# Patient Record
Sex: Female | Born: 1965 | Race: Black or African American | Hispanic: No | Marital: Single | Smoking: Never smoker
Health system: Southern US, Community
[De-identification: ages and names within clinical notes are randomized; demographics above are authoritative.]

## PROBLEM LIST (undated history)

## (undated) DIAGNOSIS — Z9889 Other specified postprocedural states: Secondary | ICD-10-CM

## (undated) DIAGNOSIS — R112 Nausea with vomiting, unspecified: Secondary | ICD-10-CM

---

## 2014-09-18 ENCOUNTER — Encounter: Payer: Self-pay | Admitting: Cardiology

## 2014-11-18 ENCOUNTER — Ambulatory Visit: Payer: Self-pay | Admitting: Cardiology

## 2014-11-22 ENCOUNTER — Encounter: Payer: Self-pay | Admitting: Cardiovascular Disease

## 2014-11-22 ENCOUNTER — Ambulatory Visit (INDEPENDENT_AMBULATORY_CARE_PROVIDER_SITE_OTHER): Payer: BC Managed Care – PPO | Admitting: Cardiovascular Disease

## 2014-11-22 VITALS — BP 100/62 | HR 93 | Ht 60.0 in | Wt 154.0 lb

## 2014-11-22 DIAGNOSIS — R140 Abdominal distension (gaseous): Secondary | ICD-10-CM

## 2014-11-22 DIAGNOSIS — Z136 Encounter for screening for cardiovascular disorders: Secondary | ICD-10-CM

## 2014-11-22 DIAGNOSIS — R0602 Shortness of breath: Secondary | ICD-10-CM

## 2014-11-22 DIAGNOSIS — K219 Gastro-esophageal reflux disease without esophagitis: Secondary | ICD-10-CM

## 2014-11-22 DIAGNOSIS — R1310 Dysphagia, unspecified: Secondary | ICD-10-CM

## 2014-11-22 MED ORDER — OMEPRAZOLE 20 MG PO CPDR: 20.0000 mg | DELAYED_RELEASE_CAPSULE | Freq: Every day | ORAL | Status: DC

## 2014-11-22 NOTE — Patient Instructions (Signed)
   Begin Omeprazole 20mg  daily - new sent to pharm Continue all other medications.   Follow up in  2 months

## 2014-11-22 NOTE — Progress Notes (Signed)
Patient ID: Sabrina Peterson, female   DOB: [birth year], 48 y.o.   MRN: [id number]       CARDIOLOGY CONSULT NOTE  Patient ID: Sabrina Peterson MRN: [id number] DOB/AGE: December 19, 1966 48 y.o.  Admit date: (Not on file) Primary Physician WADE, Silverio Lay, NP  Reason for Consultation: SOB  HPI: The patient is a 48 year old woman with no significant past medical history who has been experiencing some shortness of breath. A recent chest x-ray on 11/05/2014 was normal. ECG performed in the office today demonstrates normal sinus rhythm with no ischemic ST segment or T-wave abnormalities, heart rate 94 bpm. Upon further discussion, she tells me she has been having intermittent shortness of breath for one month. It may occur more frequently after eating. She feels like she is unable to take a deep breath. She has intermittent retrosternal chest pains. She has been having some dysphagia for solid foods. She does not smoke cigarettes or drink alcohol. She has occasional lightheadedness but denies syncope. She denies orthopnea, leg swelling, palpitations, and paroxysmal nocturnal dyspnea. She reportedly underwent abdominal imaging and stool studies at [facility] and she tells me they were found to be normal. She has been experiencing more belching and flatus. She said she sometimes snores but denies morning headaches. She does have insomnia and was prescribed trazodone. She does have diminished energy levels. She was recently diagnosed as being perimenopausal. She sometimes gets nauseous after eating.  Soc: No alcohol or tobacco use. Married. 2 children ages 28 and 80 in the [place]. 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 Lives in [place], [place].    Allergies  Allergen Reactions  . Prednisone Anxiety    Shakes and nervousness     No current outpatient prescriptions on file.   No current facility-administered medications for this visit.    No past medical history on  file.  No past surgical history on file.  History   Social History  . Marital Status: Single    Spouse Name: N/A    Number of Children: N/A  . Years of Education: N/A   Occupational History  . Not on file.   Social History Main Topics  . Smoking status: Not on file  . Smokeless tobacco: Never Used  . Alcohol Use: No  . Drug Use: No  . Sexual Activity: Yes   Other Topics Concern  . Not on file   Social History Narrative  . No narrative on file     No family history of premature CAD in 1st degree relatives.  Prior to Admission medications   Not on File     Review of systems complete and found to be negative unless listed above in HPI     Physical exam Blood pressure 100/62, pulse 93, height 5' (1.524 m), weight 154 lb (69.854 kg), SpO2 98 %. General: NAD Neck: No JVD, no thyromegaly or thyroid nodule.  Lungs: Clear to auscultation bilaterally with normal respiratory effort. CV: Nondisplaced PMI. Regular rate and rhythm, normal S1/S2, no S3/S4, no murmur.  No peripheral edema.  No carotid bruit.  Normal pedal pulses.  Abdomen: Soft, mild infraumbilical tenderness, no guarding/rebound, no hepatosplenomegaly, no distention.  Skin: Intact without lesions or rashes.  Neurologic: Alert and oriented x 3.  Psych: Normal affect. Extremities: No clubbing or cyanosis.  HEENT: Normal.   ECG: Most recent ECG reviewed.  Labs:  No results found for: WBC, HGB, HCT, MCV, PLT No results for input(s): NA, K, CL,  CO2, BUN, CREATININE, CALCIUM, PROT, BILITOT, ALKPHOS, ALT, AST, GLUCOSE in the last 168 hours.  Invalid input(s): LABALBU No results found for: CKTOTAL, CKMB, CKMBINDEX, TROPONINI No results found for: CHOL No results found for: HDL No results found for: LDLCALC No results found for: TRIG No results found for: CHOLHDL No results found for: LDLDIRECT       Studies: No results found.  ASSESSMENT AND PLAN:  1. Shortness of breath: Her symptoms appear to be  related to GERD, given her early satiety, belching, flatus, dysphagia for solids, nausea after eating, etc. I gave her extensive counseling on foods which provoke acid production. I will prescribe omeprazole 20 mg daily for a minimum of 3 months. I do not feel cardiovascular testing is warranted at this time. 2. GERD: As per #1. Dietary counseling provided. Will start omeprazole 20 mg. Will obtain results of studies performed at [facility].  Dispo: f/u 3 months.  Signed: Kate Sable, M.D., F.A.C.C.  11/22/2014, 2:07 PM

## 2015-01-27 ENCOUNTER — Ambulatory Visit: Payer: BC Managed Care – PPO | Admitting: Cardiovascular Disease

## 2017-05-03 ENCOUNTER — Other Ambulatory Visit (HOSPITAL_COMMUNITY): Payer: Self-pay | Admitting: Nurse Practitioner

## 2017-05-03 DIAGNOSIS — R1900 Intra-abdominal and pelvic swelling, mass and lump, unspecified site: Secondary | ICD-10-CM

## 2017-05-11 ENCOUNTER — Ambulatory Visit (HOSPITAL_COMMUNITY): Payer: Self-pay

## 2017-12-20 HISTORY — PX: BIOPSY ENDOMETRIAL: PRO11

## 2019-09-26 ENCOUNTER — Other Ambulatory Visit: Payer: Self-pay | Admitting: Internal Medicine

## 2019-09-26 DIAGNOSIS — Z1231 Encounter for screening mammogram for malignant neoplasm of breast: Secondary | ICD-10-CM

## 2019-11-12 ENCOUNTER — Ambulatory Visit: Payer: Self-pay

## 2020-01-30 ENCOUNTER — Other Ambulatory Visit: Payer: Self-pay | Admitting: Obstetrics & Gynecology

## 2020-01-30 DIAGNOSIS — R928 Other abnormal and inconclusive findings on diagnostic imaging of breast: Secondary | ICD-10-CM

## 2020-02-04 ENCOUNTER — Ambulatory Visit
Admission: RE | Admit: 2020-02-04 | Discharge: 2020-02-04 | Disposition: A | Discharge status: Home / Self Care | Payer: 59 | Source: Ambulatory Visit | Attending: Obstetrics & Gynecology | Admitting: Obstetrics & Gynecology

## 2020-02-04 ENCOUNTER — Other Ambulatory Visit: Payer: Self-pay

## 2020-02-04 DIAGNOSIS — R928 Other abnormal and inconclusive findings on diagnostic imaging of breast: Secondary | ICD-10-CM

## 2021-01-31 IMAGING — US US BREAST*R* LIMITED INC AXILLA
1 series · 6 of 6 positions shown · non-contrast
Comparison: Previous exam(s).

CLINICAL DATA: Screening recall for a possible right breast mass.

EXAM:
DIGITAL DIAGNOSTIC RIGHT MAMMOGRAM WITH CAD AND TOMO
ULTRASOUND RIGHT BREAST

[Series 1: us breast*right* limited inc axilla · 0.06mm/px · 6 of 6 slices shown]
[im 1/6]
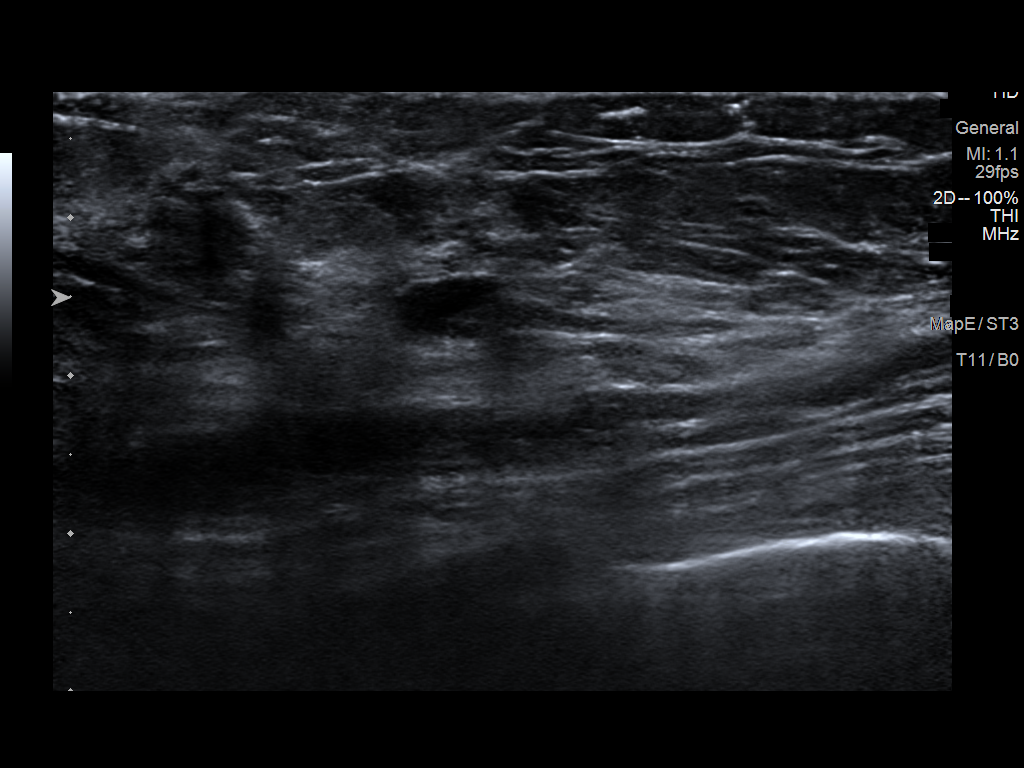
[im 2/6]
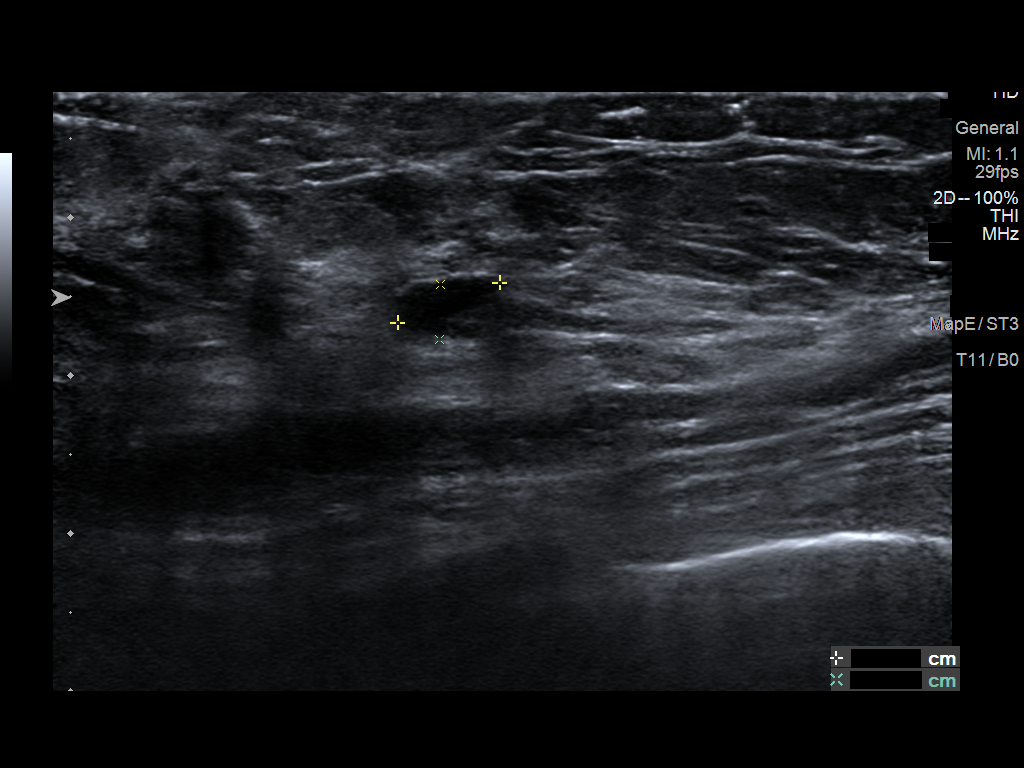
[im 3/6]
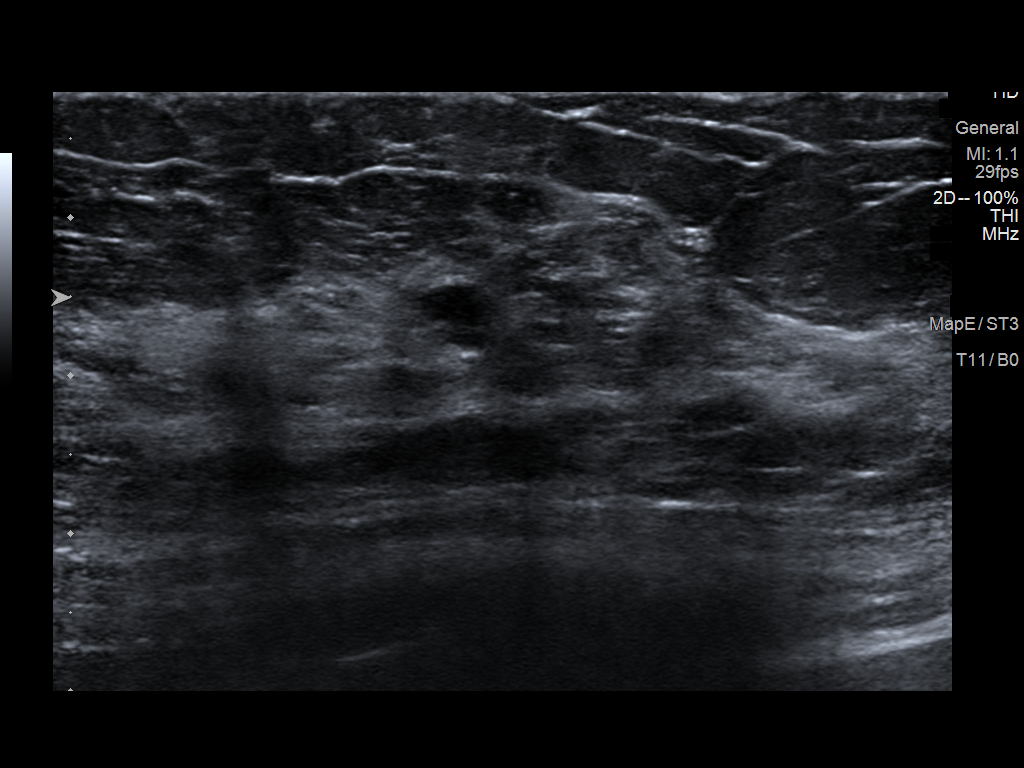
[im 4/6]
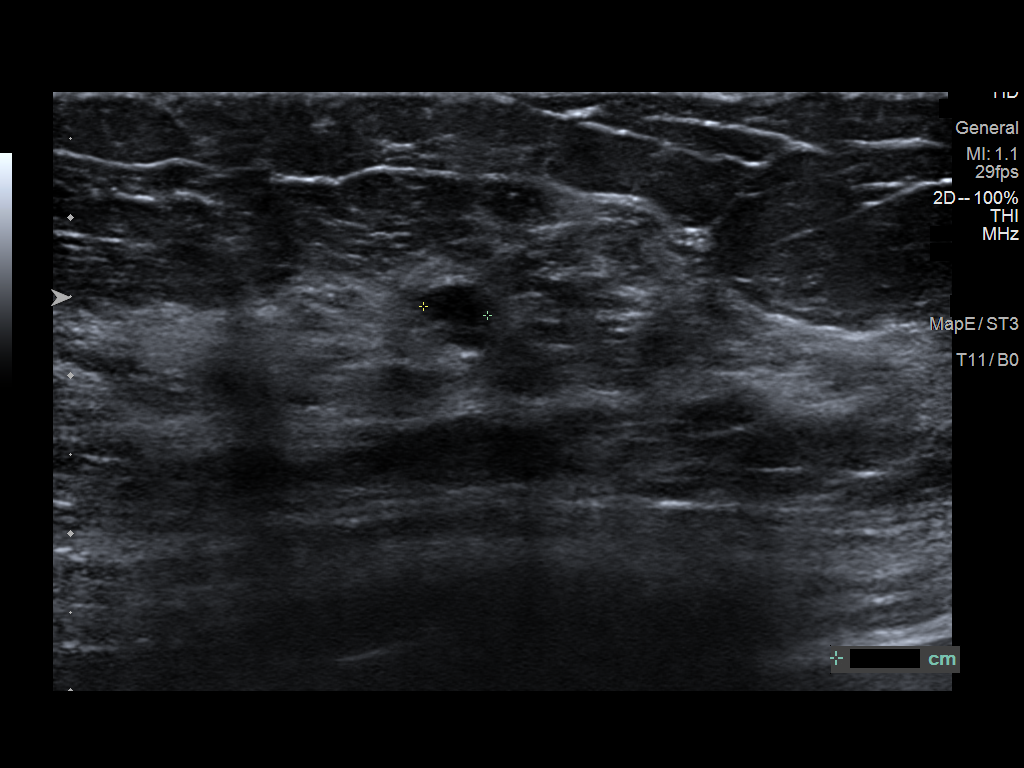
[im 5/6]
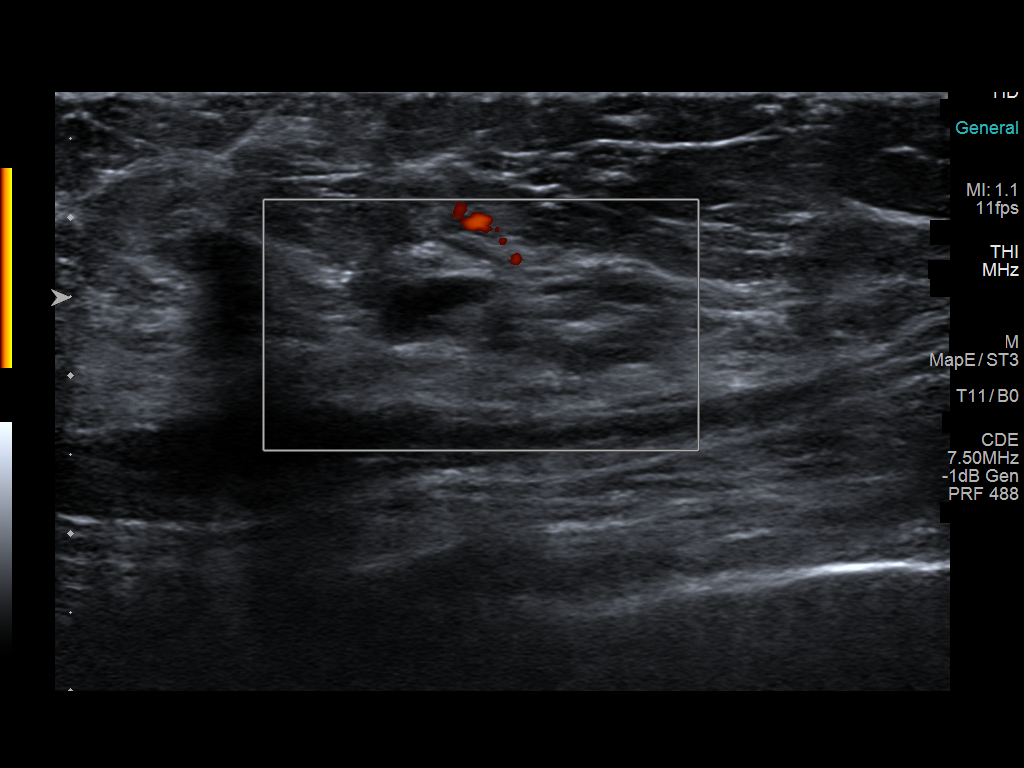
[im 6/6]
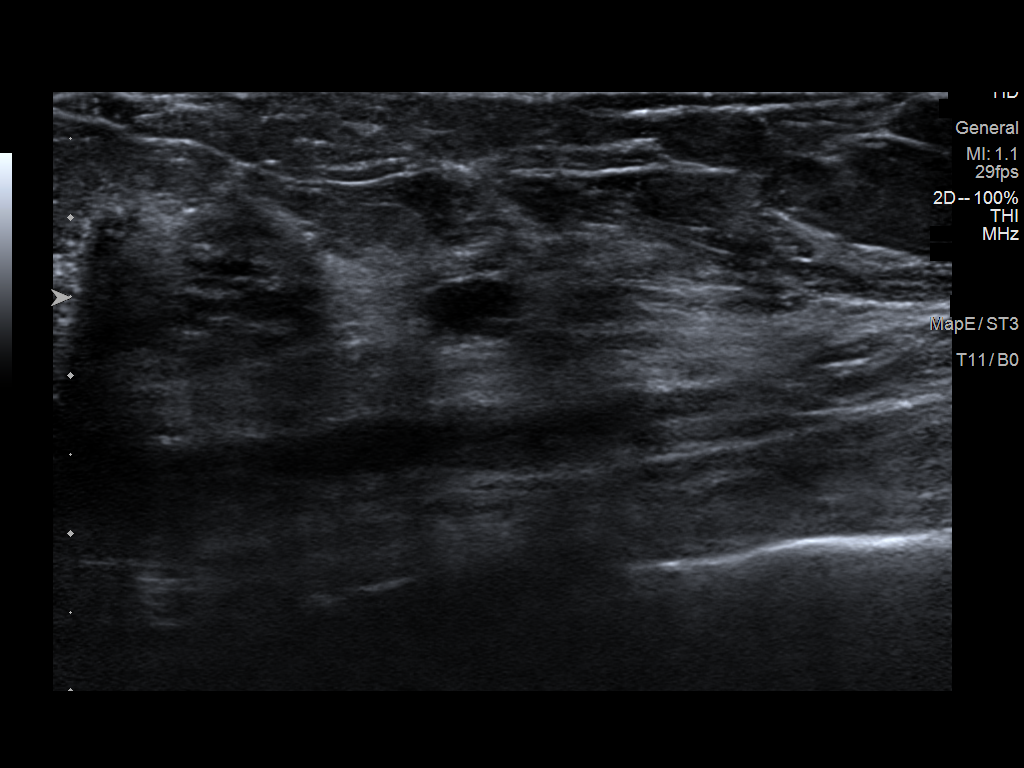

[6 of 6 positions shown; findings below may reference images not displayed]

ACR Breast Density Category c: The breast tissue is heterogeneously
dense, which may obscure small masses.
FINDINGS: On spot compression imaging, the possible mass right breast become
somewhat less well-defined, but does persist as a small oval mass
with a partly circumscribed margin. The mass lies medially at 2-3
o'clock.

Mammographic images were processed with CAD.

Targeted ultrasound is performed, showing a small cyst in the right
breast at 2 o'clock, 2 cm the nipple, middle depth, measuring 7 x 4
x 4 mm, consistent in size, shape and location to the mammographic
finding. There are no solid masses or suspicious lesions.
IMPRESSION: 1. No evidence of breast malignancy.
2. Small benign right breast cyst.

RECOMMENDATION:
1.  Screening mammogram in one year.(Code:[id number]).
2. The patient's mother was diagnosed with breast carcinoma at age
68 in her sister at age 42. This places the patient at an elevated
lifetime risk for developing breast carcinoma, over 20%, and given
her heterogeneously dense breast architecture mammographically,
supplemental screening is recommended, specifically annual screening
breast MRI based on the current mammogram Cancer Society and ACR
recommendations.

I have discussed the findings and recommendations with the patient.
If applicable, a reminder letter will be sent to the patient
regarding the next appointment.

BI-RADS CATEGORY  2: Benign.

## 2021-02-17 ENCOUNTER — Other Ambulatory Visit: Payer: Self-pay | Admitting: Obstetrics & Gynecology

## 2021-03-05 ENCOUNTER — Encounter (HOSPITAL_COMMUNITY)
Admission: RE | Admit: 2021-03-05 | Discharge: 2021-03-05 | Disposition: A | Discharge status: Home / Self Care | Payer: 59 | Source: Ambulatory Visit | Attending: Obstetrics & Gynecology | Admitting: Obstetrics & Gynecology

## 2021-03-05 ENCOUNTER — Encounter (HOSPITAL_COMMUNITY): Payer: Self-pay

## 2021-03-05 ENCOUNTER — Other Ambulatory Visit: Payer: Self-pay

## 2021-03-05 DIAGNOSIS — Z20822 Contact with and (suspected) exposure to covid-19: Secondary | ICD-10-CM | POA: Insufficient documentation

## 2021-03-05 DIAGNOSIS — Z01812 Encounter for preprocedural laboratory examination: Secondary | ICD-10-CM | POA: Insufficient documentation

## 2021-03-05 HISTORY — DX: Nausea with vomiting, unspecified: Z98.890

## 2021-03-05 HISTORY — DX: Nausea with vomiting, unspecified: R11.2

## 2021-03-05 LAB — TYPE AND SCREEN
ABO/RH(D): A POS
Antibody Screen: NEGATIVE

## 2021-03-05 LAB — COMPREHENSIVE METABOLIC PANEL
ALT: 16 U/L (ref 0–44)
AST: 18 U/L (ref 15–41)
Albumin: 4.3 g/dL (ref 3.5–5.0)
Alkaline Phosphatase: 51 U/L (ref 38–126)
Anion gap: 7 (ref 5–15)
BUN: 16 mg/dL (ref 6–20)
CO2: 25 mmol/L (ref 22–32)
Calcium: 9.4 mg/dL (ref 8.9–10.3)
Chloride: 106 mmol/L (ref 98–111)
Creatinine, Ser: 0.72 mg/dL (ref 0.44–1.00)
GFR, Estimated: >60 mL/min (ref >60)
Glucose, Bld: 144 mg/dL — ABNORMAL HIGH (ref 70–99)
Potassium: 3.5 mmol/L (ref 3.5–5.1)
Sodium: 138 mmol/L (ref 135–145)
Total Bilirubin: 0.7 mg/dL (ref 0.3–1.2)
Total Protein: 7.2 g/dL (ref 6.5–8.1)

## 2021-03-05 LAB — CBC
HCT: 40.1 % (ref 36.0–46.0)
Hemoglobin: 13.3 g/dL (ref 12.0–15.0)
MCH: 28.7 pg (ref 26.0–34.0)
MCHC: 33.2 g/dL (ref 30.0–36.0)
MCV: 86.6 fL (ref 80.0–100.0)
Platelets: 265 10*3/uL (ref 150–400)
RBC: 4.63 MIL/uL (ref 3.87–5.11)
RDW: 12.2 % (ref 11.5–15.5)
WBC: 6 10*3/uL (ref 4.0–10.5)
nRBC: 0 % (ref 0.0–0.2)

## 2021-03-05 LAB — SARS CORONAVIRUS 2 (TAT 6-24 HRS): SARS Coronavirus 2: NEGATIVE

## 2021-03-05 NOTE — Pre-Procedure Instructions (Addendum)
Surgical Instructions    Your procedure is scheduled on Monday March 21st.  Report to [facility] Main Entrance "A" at 11:00 A.M., then check in with the Admitting office.  Call this number if you have problems the morning of surgery:  [phone]   If you have any questions prior to your surgery date call [phone]: Open Monday-Friday 8am-4pm    Remember:  Do not eat after midnight the night before your surgery  You may drink clear liquids until 10:00 the morning of your surgery.   Clear liquids allowed are: Water, Non-Citrus Juices (without pulp), Carbonated Beverages, Clear Tea, Black Coffee Only, and Gatorade.    Take these medicines the morning of surgery with A SIP OF WATER  cetirizine (ZYRTEC)    Take these as needed: acetaminophen (TYLENOL) omeprazole (PRILOSEC)    As of today, STOP taking any Aspirin (unless otherwise instructed by your surgeon) Aleve, Naproxen, Ibuprofen, Motrin, Advil, Goody's, BC's, all herbal medications, fish oil, and all vitamins.                     Do not wear jewelry, make up, or nail polish            Do not wear lotions, powders, perfumes, or deodorant.            Do not shave 48 hours prior to surgery.             Do not bring valuables to the hospital.            [facility] is not responsible for any belongings or valuables.  Do NOT Smoke (Tobacco/Vaping) or drink Alcohol 24 hours prior to your procedure If you use a CPAP at night, you may bring all equipment for your overnight stay.   Contacts, glasses, dentures or bridgework may not be worn into surgery, please bring cases for these belongings   For patients admitted to the hospital, discharge time will be determined by your treatment team.   Patients discharged the day of surgery will not be allowed to drive home, and someone needs to stay with them for 24 hours.    Special instructions:   [place]- Preparing For Surgery  Before surgery, you can play an important role.  Because skin is not sterile, your skin needs to be as free of germs as possible. You can reduce the number of germs on your skin by washing with CHG (chlorahexidine gluconate) Soap before surgery.  CHG is an antiseptic cleaner which kills germs and bonds with the skin to continue killing germs even after washing.    Oral Hygiene is also important to reduce your risk of infection.  Remember - BRUSH YOUR TEETH THE MORNING OF SURGERY WITH YOUR REGULAR TOOTHPASTE  Please do not use if you have an allergy to CHG or antibacterial soaps. If your skin becomes reddened/irritated stop using the CHG.  Do not shave (including legs and underarms) for at least 48 hours prior to first CHG shower. It is OK to shave your face.  Please follow these instructions carefully.   1. Shower the NIGHT BEFORE SURGERY and the MORNING OF SURGERY  2. If you chose to wash your hair, wash your hair first as usual with your normal shampoo.  3. After you shampoo, rinse your hair and body thoroughly to remove the shampoo.  4. Use CHG Soap as you would any other liquid soap. You can apply CHG directly to the skin and wash gently with  a scrungie or a clean washcloth.   5. Apply the CHG Soap to your body ONLY FROM THE NECK DOWN.  Do not use on open wounds or open sores. Avoid contact with your eyes, ears, mouth and genitals (private parts). Wash Face and genitals (private parts)  with your normal soap.   6. Wash thoroughly, paying special attention to the area where your surgery will be performed.  7. Thoroughly rinse your body with warm water from the neck down.  8. DO NOT shower/wash with your normal soap after using and rinsing off the CHG Soap.  9. Pat yourself dry with a CLEAN TOWEL.  10. Wear CLEAN PAJAMAS to bed the night before surgery  11. Place CLEAN SHEETS on your bed the night before your surgery  12. DO NOT SLEEP WITH PETS.   Day of Surgery: Shower with CHG soap. Wear Clean/Comfortable clothing the  morning of surgery Do not apply any deodorants/lotions.   Remember to brush your teeth WITH YOUR REGULAR TOOTHPASTE.   Please read over the following fact sheets that you were given.

## 2021-03-05 NOTE — Progress Notes (Signed)
PCP - Dr. Monico Blitz Cardiologist - denies  Chest x-ray - n/a  EKG - n/a Stress Test -denies  ECHO - denies Cardiac Cath - denies  Sleep Study - denies CPAP - denies  Blood Thinner Instructions: n/a Aspirin Instructions:n/a  ERAS Protcol - stop clears by 1030 DOS PRE-SURGERY Ensure or G2- none  COVID TEST- 03/05/21 in PAT  Anesthesia review: No  Patient denies shortness of breath, fever, cough and chest pain at PAT appointment   All instructions explained to the patient, with a verbal understanding of the material. Patient agrees to go over the instructions while at home for a better understanding. Patient also instructed to self quarantine after being tested for COVID-19. The opportunity to ask questions was provided.

## 2021-03-08 NOTE — H&P (Signed)
Sabrina Peterson is an 55 y.o. female here for hysterectomy for large symptomatic fibroids, causing pressure/ back pain and pelvic pain. She had been debating this surgery for few years, hoping menopause would alleviate symptoms but has not. LMP over 1 yr back. No vaginal bleeding. ENdometrial biopsy with another GYn nl, before transferring care by choice. G2P2002, C/s x 2. Nl Paps, last Jan'21, nl per pt. Nl breast imaging. Overall healthy female.     No LMP recorded. Patient is postmenopausal.    Past Medical History:  Diagnosis Date   PONV (postoperative nausea and vomiting)     Past Surgical History:  Procedure Laterality Date   BIOPSY ENDOMETRIAL  2019   CESAREAN SECTION     x2    Family History  Problem Relation Age of Onset   Breast cancer Mother 63   Breast cancer Sister 83    Social History:  reports that she has never smoked. She has never used smokeless tobacco. She reports that she does not drink alcohol and does not use drugs.  Allergies:  Allergies  Allergen Reactions   Prednisone Itching and Anxiety    Shakes and nervousness     No medications prior to admission.    Review of Systems Bladder pressure, urge, pain. Back pain.    Physical Exam  BP (!) 174/79   Pulse 82   Temp 98.2 F (36.8 C) (Oral)   Resp 18   Ht 5' (1.524 m)   Wt 167 lb (75.8 kg)   SpO2 100%   BMI 32.62 kg/m    A&O x 3, no acute distress. Pleasant HEENT neg, no thyromegaly Lungs CTA bilat CV RRR, S1S2 normal Abdo soft, non tender, non acute, large mass of fibroid uterus at 18 cm  Extr no edema/ tenderness Pelvic Enlarged uterus with limited mobility   Assessment/Plan: 55 yo female with Large fibroid uterus, desiring hysterectomy for symptomatic fibroids. Remove tubes but retain ovaries.  Risks/complications of surgery reviewed incl infection, bleeding, damage to internal organs including bladder, bowels, ureters, blood vessels, other risks from anesthesia, VTE and delayed  complications of any surgery, complications in future surgery reviewed.  No changes noted -- Elveria Royals, MD  03/09/2021

## 2021-03-09 ENCOUNTER — Encounter (HOSPITAL_COMMUNITY): Payer: Self-pay | Admitting: Obstetrics & Gynecology

## 2021-03-09 ENCOUNTER — Inpatient Hospital Stay (HOSPITAL_COMMUNITY): Payer: 59 | Admitting: Anesthesiology

## 2021-03-09 ENCOUNTER — Inpatient Hospital Stay (HOSPITAL_COMMUNITY)
Admission: RE | Admit: 2021-03-09 | Discharge: 2021-03-11 | DRG: 743 | Disposition: A | Discharge status: Home / Self Care | Payer: 59 | Attending: Obstetrics & Gynecology | Admitting: Obstetrics & Gynecology

## 2021-03-09 ENCOUNTER — Encounter (HOSPITAL_COMMUNITY)
Admission: RE | Disposition: A | Discharge status: Home / Self Care | Payer: Self-pay | Source: Home / Self Care | Attending: Obstetrics & Gynecology

## 2021-03-09 ENCOUNTER — Other Ambulatory Visit: Payer: Self-pay

## 2021-03-09 DIAGNOSIS — Z90710 Acquired absence of both cervix and uterus: Secondary | ICD-10-CM

## 2021-03-09 DIAGNOSIS — D259 Leiomyoma of uterus, unspecified: Principal | ICD-10-CM | POA: Diagnosis present

## 2021-03-09 DIAGNOSIS — Z888 Allergy status to other drugs, medicaments and biological substances status: Secondary | ICD-10-CM | POA: Diagnosis not present

## 2021-03-09 DIAGNOSIS — N736 Female pelvic peritoneal adhesions (postinfective): Secondary | ICD-10-CM | POA: Diagnosis present

## 2021-03-09 HISTORY — PX: HYSTERECTOMY ABDOMINAL WITH SALPINGECTOMY: SHX6725

## 2021-03-09 LAB — ABO/RH: ABO/RH(D): A POS

## 2021-03-09 SURGERY — HYSTERECTOMY, TOTAL, ABDOMINAL, WITH SALPINGECTOMY
Anesthesia: General | Site: Abdomen | Laterality: Bilateral

## 2021-03-09 MED ORDER — MIDAZOLAM HCL 2 MG/2ML IJ SOLN
INTRAMUSCULAR | Status: DC | PRN
  Administered 2021-03-09: 2 mg via INTRAVENOUS

## 2021-03-09 MED ORDER — PANTOPRAZOLE SODIUM 40 MG PO TBEC
40.0000 mg | DELAYED_RELEASE_TABLET | Freq: Every day | ORAL | Status: DC
  Administered 2021-03-10: 40 mg via ORAL
  Filled 2021-03-09: qty 1

## 2021-03-09 MED ORDER — PROPOFOL 10 MG/ML IV BOLUS
INTRAVENOUS | Status: DC | PRN
  Administered 2021-03-09: 200 mg via INTRAVENOUS
  Administered 2021-03-09: 100 mg via INTRAVENOUS

## 2021-03-09 MED ORDER — HEMOSTATIC AGENTS (NO CHARGE) OPTIME
TOPICAL | Status: DC | PRN
  Administered 2021-03-09: 1 via TOPICAL

## 2021-03-09 MED ORDER — PROMETHAZINE HCL 25 MG/ML IJ SOLN: 6.2500 mg | INTRAMUSCULAR | Status: DC | PRN

## 2021-03-09 MED ORDER — KETOROLAC TROMETHAMINE 30 MG/ML IJ SOLN
INTRAMUSCULAR | Status: AC
  Filled 2021-03-09: qty 1

## 2021-03-09 MED ORDER — BUPIVACAINE HCL (PF) 0.25 % IJ SOLN
INTRAMUSCULAR | Status: DC | PRN
  Administered 2021-03-09: 30 mL

## 2021-03-09 MED ORDER — ACETAMINOPHEN 10 MG/ML IV SOLN: 1000.0000 mg | Freq: Once | INTRAVENOUS | Status: DC | PRN

## 2021-03-09 MED ORDER — OXYCODONE HCL 5 MG PO TABS
10.0000 mg | ORAL_TABLET | ORAL | Status: DC | PRN
Start: 2021-03-09 — End: 2021-03-11
  Administered 2021-03-09 – 2021-03-10 (×3): 10 mg via ORAL
  Administered 2021-03-11: 5 mg via ORAL
  Filled 2021-03-09 (×4): qty 2

## 2021-03-09 MED ORDER — ONDANSETRON HCL 4 MG/2ML IJ SOLN
INTRAMUSCULAR | Status: DC | PRN
  Administered 2021-03-09: 4 mg via INTRAVENOUS

## 2021-03-09 MED ORDER — KETOROLAC TROMETHAMINE 30 MG/ML IJ SOLN
30.0000 mg | Freq: Four times a day (QID) | INTRAMUSCULAR | Status: AC
  Administered 2021-03-09 – 2021-03-10 (×4): 30 mg via INTRAVENOUS
  Filled 2021-03-09 (×4): qty 1

## 2021-03-09 MED ORDER — SCOPOLAMINE 1 MG/3DAYS TD PT72: 1.0000 | MEDICATED_PATCH | TRANSDERMAL | Status: DC

## 2021-03-09 MED ORDER — ONDANSETRON HCL 4 MG/2ML IJ SOLN: 4.0000 mg | Freq: Four times a day (QID) | INTRAMUSCULAR | Status: DC | PRN

## 2021-03-09 MED ORDER — DIPHENHYDRAMINE HCL 50 MG/ML IJ SOLN
INTRAMUSCULAR | Status: DC | PRN
  Administered 2021-03-09: 12.5 mg via INTRAVENOUS

## 2021-03-09 MED ORDER — ACETAMINOPHEN 10 MG/ML IV SOLN
INTRAVENOUS | Status: AC
  Filled 2021-03-09: qty 100

## 2021-03-09 MED ORDER — SCOPOLAMINE 1 MG/3DAYS TD PT72
MEDICATED_PATCH | TRANSDERMAL | Status: AC
  Filled 2021-03-09: qty 1

## 2021-03-09 MED ORDER — IBUPROFEN 800 MG PO TABS
800.0000 mg | ORAL_TABLET | Freq: Four times a day (QID) | ORAL | Status: DC
  Administered 2021-03-10 – 2021-03-11 (×3): 800 mg via ORAL
  Filled 2021-03-09 (×3): qty 1

## 2021-03-09 MED ORDER — POVIDONE-IODINE 10 % EX SWAB
2.0000 "application " | Freq: Once | CUTANEOUS | Status: AC
  Administered 2021-03-09: 2 via TOPICAL

## 2021-03-09 MED ORDER — FENTANYL CITRATE (PF) 100 MCG/2ML IJ SOLN
25.0000 ug | INTRAMUSCULAR | Status: DC | PRN
  Administered 2021-03-09: 50 ug via INTRAVENOUS

## 2021-03-09 MED ORDER — BUPIVACAINE HCL (PF) 0.25 % IJ SOLN
INTRAMUSCULAR | Status: AC
  Filled 2021-03-09: qty 30

## 2021-03-09 MED ORDER — DEXAMETHASONE SODIUM PHOSPHATE 10 MG/ML IJ SOLN
INTRAMUSCULAR | Status: DC | PRN
  Administered 2021-03-09: 8 mg via INTRAVENOUS

## 2021-03-09 MED ORDER — ACETAMINOPHEN 500 MG PO TABS
1000.0000 mg | ORAL_TABLET | Freq: Four times a day (QID) | ORAL | Status: DC
  Administered 2021-03-10 – 2021-03-11 (×5): 1000 mg via ORAL
  Filled 2021-03-09 (×6): qty 2

## 2021-03-09 MED ORDER — SUGAMMADEX SODIUM 200 MG/2ML IV SOLN
INTRAVENOUS | Status: DC | PRN
  Administered 2021-03-09: 200 mg via INTRAVENOUS

## 2021-03-09 MED ORDER — ACETAMINOPHEN 10 MG/ML IV SOLN
INTRAVENOUS | Status: DC | PRN
  Administered 2021-03-09: 1000 mg via INTRAVENOUS

## 2021-03-09 MED ORDER — LORATADINE 10 MG PO TABS
10.0000 mg | ORAL_TABLET | Freq: Every day | ORAL | Status: DC
  Administered 2021-03-10: 10 mg via ORAL
  Filled 2021-03-09: qty 1

## 2021-03-09 MED ORDER — LIDOCAINE 2% (20 MG/ML) 5 ML SYRINGE
INTRAMUSCULAR | Status: DC | PRN
  Administered 2021-03-09: 60 mg via INTRAVENOUS

## 2021-03-09 MED ORDER — MENTHOL 3 MG MT LOZG: 1.0000 | LOZENGE | OROMUCOSAL | Status: DC | PRN

## 2021-03-09 MED ORDER — KETOROLAC TROMETHAMINE 30 MG/ML IJ SOLN: 30.0000 mg | Freq: Once | INTRAMUSCULAR | Status: DC

## 2021-03-09 MED ORDER — METOCLOPRAMIDE HCL 5 MG/ML IJ SOLN
INTRAMUSCULAR | Status: DC | PRN
  Administered 2021-03-09: 10 mg via INTRAVENOUS

## 2021-03-09 MED ORDER — FENTANYL CITRATE (PF) 250 MCG/5ML IJ SOLN
INTRAMUSCULAR | Status: DC | PRN
  Administered 2021-03-09 (×3): 50 ug via INTRAVENOUS

## 2021-03-09 MED ORDER — ROCURONIUM BROMIDE 10 MG/ML (PF) SYRINGE
PREFILLED_SYRINGE | INTRAVENOUS | Status: DC | PRN
  Administered 2021-03-09 (×2): 50 mg via INTRAVENOUS

## 2021-03-09 MED ORDER — ONDANSETRON HCL 4 MG/2ML IJ SOLN
INTRAMUSCULAR | Status: AC
  Filled 2021-03-09: qty 2

## 2021-03-09 MED ORDER — KETAMINE HCL 50 MG/5ML IJ SOSY
PREFILLED_SYRINGE | INTRAMUSCULAR | Status: AC
  Filled 2021-03-09: qty 5

## 2021-03-09 MED ORDER — 0.9 % SODIUM CHLORIDE (POUR BTL) OPTIME
TOPICAL | Status: DC | PRN
  Administered 2021-03-09: 1000 mL

## 2021-03-09 MED ORDER — CHLORHEXIDINE GLUCONATE 0.12 % MT SOLN
OROMUCOSAL | Status: AC
  Administered 2021-03-09: 15 mL
  Filled 2021-03-09: qty 15

## 2021-03-09 MED ORDER — DIPHENHYDRAMINE HCL 50 MG/ML IJ SOLN
INTRAMUSCULAR | Status: AC
  Filled 2021-03-09: qty 1

## 2021-03-09 MED ORDER — FENTANYL CITRATE (PF) 250 MCG/5ML IJ SOLN
INTRAMUSCULAR | Status: AC
  Filled 2021-03-09: qty 5

## 2021-03-09 MED ORDER — SCOPOLAMINE 1 MG/3DAYS TD PT72
MEDICATED_PATCH | TRANSDERMAL | Status: AC
  Administered 2021-03-09: 1.5 mg via TRANSDERMAL
  Filled 2021-03-09: qty 1

## 2021-03-09 MED ORDER — METOCLOPRAMIDE HCL 5 MG/ML IJ SOLN
INTRAMUSCULAR | Status: AC
  Filled 2021-03-09: qty 2

## 2021-03-09 MED ORDER — CEFAZOLIN SODIUM-DEXTROSE 2-4 GM/100ML-% IV SOLN
2.0000 g | INTRAVENOUS | Status: AC
  Administered 2021-03-09: 2 g via INTRAVENOUS

## 2021-03-09 MED ORDER — ONDANSETRON HCL 4 MG PO TABS: 4.0000 mg | ORAL_TABLET | Freq: Four times a day (QID) | ORAL | Status: DC | PRN

## 2021-03-09 MED ORDER — FENTANYL CITRATE (PF) 100 MCG/2ML IJ SOLN
INTRAMUSCULAR | Status: AC
  Administered 2021-03-09: 25 ug via INTRAVENOUS
  Filled 2021-03-09: qty 2

## 2021-03-09 MED ORDER — KETAMINE HCL 10 MG/ML IJ SOLN
INTRAMUSCULAR | Status: DC | PRN
  Administered 2021-03-09: 30 mg via INTRAVENOUS
  Administered 2021-03-09: 20 mg via INTRAVENOUS

## 2021-03-09 MED ORDER — HYDROMORPHONE HCL 1 MG/ML IJ SOLN
0.5000 mg | INTRAMUSCULAR | Status: DC | PRN
  Administered 2021-03-10: 0.5 mg via INTRAVENOUS
  Filled 2021-03-09: qty 1

## 2021-03-09 MED ORDER — KETOROLAC TROMETHAMINE 30 MG/ML IJ SOLN
INTRAMUSCULAR | Status: DC | PRN
  Administered 2021-03-09: 30 mg via INTRAVENOUS

## 2021-03-09 MED ORDER — DEXAMETHASONE SODIUM PHOSPHATE 10 MG/ML IJ SOLN
INTRAMUSCULAR | Status: AC
  Filled 2021-03-09: qty 1

## 2021-03-09 MED ORDER — LACTATED RINGERS IV SOLN: INTRAVENOUS | Status: DC | PRN

## 2021-03-09 MED ORDER — MIDAZOLAM HCL 2 MG/2ML IJ SOLN
INTRAMUSCULAR | Status: AC
  Filled 2021-03-09: qty 2

## 2021-03-09 MED ORDER — CEFAZOLIN SODIUM-DEXTROSE 2-4 GM/100ML-% IV SOLN
INTRAVENOUS | Status: AC
  Filled 2021-03-09: qty 100

## 2021-03-09 MED ORDER — APREPITANT 40 MG PO CAPS
40.0000 mg | ORAL_CAPSULE | Freq: Once | ORAL | Status: AC
  Administered 2021-03-09: 40 mg via ORAL
  Filled 2021-03-09 (×2): qty 1

## 2021-03-09 MED ORDER — AMISULPRIDE (ANTIEMETIC) 5 MG/2ML IV SOLN: 10.0000 mg | Freq: Once | INTRAVENOUS | Status: DC | PRN

## 2021-03-09 MED ORDER — PHENYLEPHRINE HCL-NACL 10-0.9 MG/250ML-% IV SOLN
INTRAVENOUS | Status: DC | PRN
  Administered 2021-03-09: 25 ug/min via INTRAVENOUS

## 2021-03-09 SURGICAL SUPPLY — 37 items
BENZOIN TINCTURE PRP APPL 2/3 (GAUZE/BANDAGES/DRESSINGS) ×2 IMPLANT
CANISTER SUCT 3000ML PPV (MISCELLANEOUS) ×2 IMPLANT
COVER WAND RF STERILE (DRAPES) ×2 IMPLANT
DRAPE CESAREAN BIRTH W POUCH (DRAPES) ×2 IMPLANT
DRAPE WARM FLUID 44X44 (DRAPES) IMPLANT
DRSG OPSITE POSTOP 4X10 (GAUZE/BANDAGES/DRESSINGS) ×2 IMPLANT
DURAPREP 26ML APPLICATOR (WOUND CARE) ×2 IMPLANT
GAUZE 4X4 16PLY RFD (DISPOSABLE) IMPLANT
GAUZE SPONGE 4X4 12PLY STRL (GAUZE/BANDAGES/DRESSINGS) ×2 IMPLANT
GLOVE BIO SURGEON STRL SZ7 (GLOVE) ×2 IMPLANT
GLOVE SURG UNDER POLY LF SZ7 (GLOVE) ×6 IMPLANT
GOWN STRL REUS W/ TWL LRG LVL3 (GOWN DISPOSABLE) ×3 IMPLANT
GOWN STRL REUS W/TWL LRG LVL3 (GOWN DISPOSABLE) ×3
HEMOSTAT ARISTA ABSORB 3G PWDR (HEMOSTASIS) ×2 IMPLANT
HIBICLENS CHG 4% 4OZ BTL (MISCELLANEOUS) ×2 IMPLANT
KIT TURNOVER KIT B (KITS) ×2 IMPLANT
LIGASURE IMPACT 36 18CM CVD LR (INSTRUMENTS) IMPLANT
NEEDLE HYPO 22GX1.5 SAFETY (NEEDLE) ×2 IMPLANT
NS IRRIG 1000ML POUR BTL (IV SOLUTION) ×2 IMPLANT
PACK ABDOMINAL GYN (CUSTOM PROCEDURE TRAY) ×2 IMPLANT
PAD ABD 8X10 STRL (GAUZE/BANDAGES/DRESSINGS) ×2 IMPLANT
PAD ARMBOARD 7.5X6 YLW CONV (MISCELLANEOUS) ×2 IMPLANT
PAD OB MATERNITY 4.3X12.25 (PERSONAL CARE ITEMS) ×2 IMPLANT
PENCIL SMOKE EVACUATOR (MISCELLANEOUS) ×2 IMPLANT
SPONGE LAP 18X18 RF (DISPOSABLE) ×4 IMPLANT
STRIP CLOSURE SKIN 1/2X4 (GAUZE/BANDAGES/DRESSINGS) ×2 IMPLANT
SUT PLAIN 2 0 XLH (SUTURE) IMPLANT
SUT PROLENE 0 CT 1 30 (SUTURE) IMPLANT
SUT VIC AB 0 CT1 18XCR BRD8 (SUTURE) ×3 IMPLANT
SUT VIC AB 0 CT1 36 (SUTURE) ×8 IMPLANT
SUT VIC AB 0 CT1 8-18 (SUTURE) ×3
SUT VIC AB 4-0 KS 27 (SUTURE) IMPLANT
SUT VIC AB 4-0 SH 18 (SUTURE) IMPLANT
SUT VICRYL 0 TIES 12 18 (SUTURE) ×2 IMPLANT
SYR CONTROL 10ML LL (SYRINGE) ×2 IMPLANT
TOWEL GREEN STERILE FF (TOWEL DISPOSABLE) ×4 IMPLANT
TRAY FOLEY W/BAG SLVR 14FR (SET/KITS/TRAYS/PACK) ×2 IMPLANT

## 2021-03-09 NOTE — Anesthesia Procedure Notes (Signed)
Procedure Name: Intubation Date/Time: 03/09/2021 2:22 PM Performed by: Kathryne Hitch, CRNA Pre-anesthesia Checklist: Patient identified, Emergency Drugs available, Suction available and Patient being monitored Patient Re-evaluated:Patient Re-evaluated prior to induction Oxygen Delivery Method: Circle system utilized Preoxygenation: Pre-oxygenation with 100% oxygen Induction Type: IV induction Ventilation: Mask ventilation without difficulty Laryngoscope Size: Miller and 2 Grade View: Grade II Tube type: Oral Number of attempts: 1 Airway Equipment and Method: Stylet and Oral airway Placement Confirmation: ETT inserted through vocal cords under direct vision,  positive ETCO2 and breath sounds checked- equal and bilateral Tube secured with: Tape Dental Injury: Teeth and Oropharynx as per pre-operative assessment  Comments: Performed by Tandy Gaw srna

## 2021-03-09 NOTE — Progress Notes (Signed)
Pt arrived to room 6N03 via bed after surgery. Received report from Shirlean Mylar, RN in PACU. See assessment. Will continue to monitor.

## 2021-03-09 NOTE — Anesthesia Preprocedure Evaluation (Addendum)
Anesthesia Evaluation  Patient identified by MRN, date of birth, ID band Patient awake    Reviewed: Allergy & Precautions, NPO status , Patient's Chart, lab work & pertinent test results  History of Anesthesia Complications (+) PONV  Airway Mallampati: II  TM Distance: >3 FB Neck ROM: Full    Dental  (+) Teeth Intact   Pulmonary neg pulmonary ROS,    Pulmonary exam normal        Cardiovascular negative cardio ROS   Rhythm:Regular Rate:Normal     Neuro/Psych negative neurological ROS  negative psych ROS   GI/Hepatic negative GI ROS, Neg liver ROS,   Endo/Other  negative endocrine ROS  Renal/GU negative Renal ROS  Female GU complaint Fibroids     Musculoskeletal negative musculoskeletal ROS (+)   Abdominal (+)  Abdomen: soft. Bowel sounds: normal.  Peds  Hematology negative hematology ROS (+)   Anesthesia Other Findings   Reproductive/Obstetrics                            Anesthesia Physical Anesthesia Plan  ASA: II  Anesthesia Plan: General   Post-op Pain Management:    Induction: Intravenous  PONV Risk Score and Plan: 4 or greater and Ondansetron, Aprepitant, Dexamethasone, Midazolam, Scopolamine patch - Pre-op and Treatment may vary due to age or medical condition  Airway Management Planned: Mask and Oral ETT  Additional Equipment: None  Intra-op Plan:   Post-operative Plan: Extubation in OR  Informed Consent: I have reviewed the patients History and Physical, chart, labs and discussed the procedure including the risks, benefits and alternatives for the proposed anesthesia with the patient or authorized representative who has indicated his/her understanding and acceptance.     Dental advisory given  Plan Discussed with: CRNA  Anesthesia Plan Comments: (Lab Results      Component                Value               Date                      WBC                       6.0                 03/05/2021                HGB                      13.3                03/05/2021                HCT                      40.1                03/05/2021                MCV                      86.6                03/05/2021                PLT  265                 03/05/2021           Lab Results      Component                Value               Date                      NA                       138                 03/05/2021                K                        3.5                 03/05/2021                CO2                      25                  03/05/2021                GLUCOSE                  144 (H)             03/05/2021                BUN                      16                  03/05/2021                CREATININE               0.72                03/05/2021                CALCIUM                  9.4                 03/05/2021                GFRNONAA                 >60                 03/05/2021          )       Anesthesia Quick Evaluation

## 2021-03-09 NOTE — Op Note (Signed)
PRE-OPERATIVE DIAGNOSIS:  Symptomatic Uterine Fibroids with pain,pressure, abdominal mass and back pain  POST-OPERATIVE DIAGNOSIS:  Same  PROCEDURE:  TOTAL ABDOMINAL HYSTERECTOMY, BILATERAL SALPINGECTOMY  SURGEON: Elveria Royals, MD  ASSISTANT: Tiana Loft, MD  ANESTHESIA:  General endotracheal  EBL: 75 cc  IVF: LR 1400 cc  Urine output: urine in foley  100 cc clear   LOCAL MEDICATIONS USED:  MARCAINE 0.25% 10 cc skin infiltration     SPECIMEN: Uterus, cervix, both fallopian tubes and ovaries   DISPOSITION OF SPECIMEN:  PATHOLOGY  COUNTS:  YES  PATIENT DISPOSITION:  PACU - hemodynamically stable. Then admit for post-op care.   Delay start of Pharmacological VTE agent (>24hrs) due to surgical blood loss or risk of bleeding: yes  PROCEDURE:   Indication:  Symptomatic uterine fibroids with abdominal mass, pelvic pain, back pain and pressure. She is menopausal with LMP over 1 year back. She is taking testosterone and progesterone HRT. Risks and complications of surgery including infection, bleeding, damage to internal organs and other including but not limited to surgery related problems including pneumonia, VTE reviewed. Informed written consent was obtained.   Patient was brought to the operating room with IV running. She received 2 gm Ancef. Underwent general anesthesia without difficulty and was given dorsal supine position, prepped and draped in sterile fashion. Foley catheter was placed. Exam under anesthesia noted uterus at the umbilicus but mobile. Pfannenstiel incision was made via old c-section scars with scalpel and carried down to the underlying fascia with Bovie with excellent hemostasis.  Dense scar, stuck to fascia encountered and needed Bovie dissection to release from fascia. Bleeding controlled. Fascia incised and extended laterally. Fascia grasped with Kocher's and underlying rectus muscles were dissected down. Rectus muscles were separated in midline. Muscles  densely adherent in midline. Muscle edges grasped and tented up and using scalpel incision made in midline till small peritoneal window noted. Large fibroid uterus was palpated. No adhesions noted in upper abdomen but dense lower uterine bladder adhesions noted. Uterus was delivered out of the incision with manipulation. Fallopian tubes and both ovaries appear normal. Over 40 minutes spent in dissecting bladder adhesions. Bladder was back filled with milk and dissection completed without bladder injury creating safe plains to work with and then bladder emptied.  Now hysterectomy was begun. Bilateral round ligaments were grasped, incised and suture ligated with 0 Vicryl and mosquito clamps placed at the ends. Anterior broad ligament was dissected and cut to creat bladder flap. Bladder was pushed further away with blunt dissection with sponge stick. A window was created in posterior broad ligament and right Infundibulopelvic ligament was clamped x2 with Heaney clamps and cut in between and two free ties of 0-Vicryl placed. Hemostasis excellent. Left broad ligament was dissected and left IP ligament was clamped x 2 through window in broad ligament and cut and tied with free ties x 2 with 0 Vicryl, hemostasis noted. Posterior broad ligament was dissected bilaterally and uterine vessels were skeletonized. Bilateral uterine vessels were clamped with Heaney clamps and cut and transfixed with 0 Vicryl. Uterus was amputated above the cervix and passed off and cervical stump was grasped with Kochers. Straight Heaney clamps applied bilaterally, Cardinal ligaments cut and transfixed with 0 Vicryl in several steps bilaterally to reach uterosacral level. Then bilateral curved Heaney's applied to vaginal angles with Uterosacral ligament and cut and transfixed. Vaginal opening noted, was cut circumferentially and cervix was passed off with specimen.  Vaginal cut was closed at two angles then interrupted  figure of eight sutures  with 0 Vicryl interlocking sutures with excellent hemostasis. All pedicles appeared dry. Suction irrigation done. Bladder dissection area noted some bleeding and was cauterized and Arista sprayed, hemostasis noted. Lap packs removed. Peritoneal edges grasped and peritoneum closed with 2-0 Vicryl. Fascia sutured with 0 Vicryl from two ends and met in midline. Subcutaneous scar tissue to fascia released to allow better closure of fascia and skin but didn't seem to help, the scar remained puckered down in the fold. Subcutaneous layer was deep and closed with 2-0 Plain gut. Skin approximated with 4-0 Vicryl in subcuticular fashion.  Sterile dressing placed.  Sterile Honeycomb dressing placed.  Pressure dressing placed due to subcutaneous dissection.  All  Instruments/ lap/ sponges counts were correct x2. No complications.  Dr Benjie Karvonen was the surgeon for entire case.   --I performed this surgery. Criss Alvine Estoria Geary MD

## 2021-03-09 NOTE — Transfer of Care (Addendum)
Immediate Anesthesia Transfer of Care Note  Patient: Sabrina Peterson  Procedure(s) Performed: ABDOMINALHYSTERECTOMY ABDOMINAL WITH BILATERAL SALPINGO-OOPHORECTOMY (Bilateral Abdomen)  Patient Location: PACU  Anesthesia Type:General  Level of Consciousness: drowsy  Airway & Oxygen Therapy: Patient Spontanous Breathing and Patient connected to face mask oxygen  Post-op Assessment: Report given to RN and Post -op Vital signs reviewed and stable  Post vital signs: Reviewed and stable  Last Vitals:  Vitals Value Taken Time  BP 126/69   Temp    Pulse 85   Resp 23   SpO2 99     Last Pain:  Vitals:   03/09/21 1129  TempSrc:   PainSc: 0-No pain         Complications: No complications documented.

## 2021-03-10 ENCOUNTER — Encounter (HOSPITAL_COMMUNITY): Payer: Self-pay | Admitting: Obstetrics & Gynecology

## 2021-03-10 LAB — BASIC METABOLIC PANEL
Anion gap: 9 (ref 5–15)
BUN: 8 mg/dL (ref 6–20)
CO2: 23 mmol/L (ref 22–32)
Calcium: 9.2 mg/dL (ref 8.9–10.3)
Chloride: 104 mmol/L (ref 98–111)
Creatinine, Ser: 0.66 mg/dL (ref 0.44–1.00)
GFR, Estimated: >60 mL/min (ref >60)
Glucose, Bld: 135 mg/dL — ABNORMAL HIGH (ref 70–99)
Potassium: 3.5 mmol/L (ref 3.5–5.1)
Sodium: 136 mmol/L (ref 135–145)

## 2021-03-10 LAB — CBC
HCT: 36.6 % (ref 36.0–46.0)
Hemoglobin: 12 g/dL (ref 12.0–15.0)
MCH: 28.2 pg (ref 26.0–34.0)
MCHC: 32.8 g/dL (ref 30.0–36.0)
MCV: 86.1 fL (ref 80.0–100.0)
Platelets: 221 10*3/uL (ref 150–400)
RBC: 4.25 MIL/uL (ref 3.87–5.11)
RDW: 11.9 % (ref 11.5–15.5)
WBC: 9.4 10*3/uL (ref 4.0–10.5)
nRBC: 0 % (ref 0.0–0.2)

## 2021-03-10 NOTE — Progress Notes (Signed)
1 Day Post-Op Procedure(s) (LRB): ABDOMINALHYSTERECTOMY ABDOMINAL WITH BILATERAL SALPINGO-OOPHORECTOMY (Bilateral)  Subjective: Patient reports tolerating PO and no problems voiding. Pain controlled with meds. No vag bleeding. Feels well   Objective: I have reviewed patient's vital signs, intake and output, medications and labs.  General: alert and cooperative Resp: clear to auscultation bilaterally Cardio: regular rate and rhythm, S1, S2 normal, no murmur, click, rub or gallop GI: soft, non-tender; bowel sounds normal; no masses,  no organomegaly and incision: clean, dry and intact Extremities: extremities normal, atraumatic, no cyanosis or edema and Homans sign is negative, no sign of DVT Vaginal Bleeding: none  Assessment: s/p Procedure(s): ABDOMINALHYSTERECTOMY ABDOMINAL WITH BILATERAL SALPINGO-OOPHORECTOMY (Bilateral): stable, progressing well, tolerating diet and ambulating  Plan: Advance diet Encourage ambulation Discharge home tomorrow  Surgical findings d/w pt   LOS: 1 day    Sabrina Peterson 03/10/2021, 6:58 PM

## 2021-03-10 NOTE — Anesthesia Postprocedure Evaluation (Signed)
Anesthesia Post Note  Patient: Sabrina Peterson  Procedure(s) Performed: ABDOMINALHYSTERECTOMY ABDOMINAL WITH BILATERAL SALPINGO-OOPHORECTOMY (Bilateral Abdomen)     Patient location during evaluation: PACU Anesthesia Type: General Level of consciousness: awake and alert Pain management: pain level controlled Vital Signs Assessment: post-procedure vital signs reviewed and stable Respiratory status: spontaneous breathing, nonlabored ventilation and respiratory function stable Cardiovascular status: blood pressure returned to baseline and stable Postop Assessment: no apparent nausea or vomiting Anesthetic complications: no   No complications documented.  Last Vitals:  Vitals:   03/10/21 0536 03/10/21 0536  BP: (!) 110/51 (!) 110/51  Pulse: 97 98  Resp:    Temp: 37.1 C 37.1 C  SpO2: 96% 95%    Last Pain:  Vitals:   03/10/21 0630  TempSrc:   PainSc: Asleep                 Lynda Rainwater

## 2021-03-11 LAB — SURGICAL PATHOLOGY

## 2021-03-11 MED ORDER — OXYCODONE HCL 10 MG PO TABS
5.0000 mg | ORAL_TABLET | Freq: Four times a day (QID) | ORAL | 0 refills | Status: AC | PRN
Start: 2021-03-11 — End: 2021-03-16

## 2021-03-11 MED ORDER — IBUPROFEN 800 MG PO TABS: 800.0000 mg | ORAL_TABLET | Freq: Four times a day (QID) | ORAL | 0 refills | Status: AC

## 2021-03-11 NOTE — Discharge Instructions (Signed)
Abdominal Hysterectomy, Care After The following information offers guidance on how to care for yourself after your procedure. Your doctor may also give you more specific instructions. If you have problems or questions, contact your doctor. What can I expect after the procedure? After the procedure, it is common to have:  Pain.  Tiredness.  No desire to eat.  Less interest in sex.  Bleeding and fluid (discharge) from your vagina. You may need to use a pad after this procedure.  Trouble pooping (constipation).  Feelings of sadness or other emotions. Follow these instructions at home: Medicines  Take over-the-counter and prescription medicines only as told by your doctor.  Do not take aspirin or NSAIDs, such as ibuprofen. These medicines can cause bleeding.  If you were prescribed an antibiotic medicine, take it as told by your doctor. Do not stop using the antibiotic even if you start to feel better.  If told, take steps to prevent problems with pooping (constipation). You may need to: ? Drink enough fluid to keep your pee (urine) pale yellow. ? Take medicines. You will be told what medicines to take. ? Eat foods that are high in fiber. These include beans, whole grains, and fresh fruits and vegetables. ? Limit foods that are high in fat and sugar. These include fried or sweet foods.  Ask your doctor if you should avoid driving or using machines while you are taking your medicine. Surgical cut care  Follow instructions from your doctor about how to take care of your cut from surgery (incision). Make sure you: ? Wash your hands with soap and water for at least 20 seconds before and after you change your bandage. If you cannot use soap and water, use hand sanitizer. ? Change your bandage. ? Leave stitches or skin glue in place for at least two weeks. ? Leave tape strips alone unless you are told to take them off. You may trim the edges of the tape strips if they curl up.  Keep  the bandage dry until your doctor says it can be taken off.  Check your incision every day for signs of infection. Check for: ? More redness, swelling, or pain. ? Fluid or blood. ? Warmth. ? Pus or a bad smell.   Activity  Rest as told by your doctor.  Get up to take short walks every 1 to 2 hours. Ask for help if you feel weak or unsteady.  Do not lift anything that is heavier than 10 lb (4.5 kg), or the limit that you are told.  Follow your doctor's advice about exercise, driving, and general activities.  Return to your normal activities when your doctor says that it is safe.   Lifestyle  Do not douche, use tampons, or have sex for at least 6 weeks or as told by your doctor.  Do not drink alcohol until your doctor says it is okay.  Do not smoke or use any products that contain nicotine or tobacco. These can delay healing after surgery. If you need help quitting, ask your doctor. General instructions  Do not take baths, swim, or use a hot tub. Ask your doctor about taking showers or sponge baths.  Try to have a responsible adult at home with you for the first 1-2 weeks to help with your daily chores.  Wear tight-fitting (compression) stockings as told by your doctor.  Keep all follow-up visits. Contact a doctor if:  You have chills or a fever.  You have any of these signs  of infection around your cut: ? More redness, swelling, or pain. ? Fluid or blood. ? Warmth. ? Pus or a bad smell.  Your cut breaks open.  You feel dizzy or light-headed.  You have pain or bleeding when you pee.  You keep having watery poop (diarrhea).  You keep feeling like you may vomit or you keep vomiting.  You have fluid coming from your vagina that is not normal.  You have any type of reaction to your medicine that is not normal, like a rash, or you develop an allergy to your medicine.  Your pain medicine does not help. Get help right away if:  You have a fever and your symptoms  get worse suddenly.  You have very bad pain in your belly (abdomen).  You are short of breath.  You faint.  You have pain, swelling, or redness of your leg.  You bleed a lot from your vagina and you see blood clots. Summary  It is normal to have some pain, tiredness, and fluid that comes from your vagina.  Do not take baths, swim, or use a hot tub. Ask your doctor about taking showers or sponge baths.  Do not lift anything that is heavier than 10 lb (4.5 kg), or the limit that you are told.  Follow your doctor's advice about exercise, driving, and general activities.  Try to have a responsible adult at home with you for the first 1-2 weeks to help with your daily chores. This information is not intended to replace advice given to you by your health care provider. Make sure you discuss any questions you have with your health care provider. Document Revised: 08/07/2020 Document Reviewed: 08/07/2020 Elsevier Patient Education  2021 [employer].

## 2021-03-11 NOTE — Discharge Summary (Signed)
Physician Discharge Summary  Patient ID: Sabrina Peterson MRN: [id number] DOB/AGE: 1966-04-10 55 y.o.  Admit date: 03/09/2021 Discharge date: 03/11/2021  Admission Diagnoses:Symptomatic uterine fibroids   Discharge Diagnoses:  Active Problems:   Uterine fibroid   S/P TAH-BSO (total abdominal hysterectomy and bilateral salpingo-oophorectomy)   Discharged Condition: good  Hospital Course: Uncomplicated post-op recovery. By post-op day 2 flatus +, eating regular diet, ambulating. Voiding and pain well controlled, no vaginal bleeding    Discharge Exam: Blood pressure 129/71, pulse 86, temperature 98.5 F (36.9 C), temperature source Oral, resp. rate 17, height 5' (1.524 m), weight 75.8 kg, SpO2 96 %. General appearance: alert and cooperative Resp: clear to auscultation bilaterally Cardio: regular rate and rhythm, S1, S2 normal, no murmur, click, rub or gallop GI: soft, non-tender; bowel sounds normal; no masses,  no organomegaly Extremities: Homans sign is negative, no sign of DVT Incision/Wound: Dressing dry, no erythema/ hematoma/ drainage, No vaginal bleeding  Disposition: Discharge disposition: 01-Home or Self Care       Discharge Instructions    Call MD for:   Complete by: As directed    Vaginal bleeding that is more than few dots of blood   Call MD for:  difficulty breathing, headache or visual disturbances   Complete by: As directed    Call MD for:  hives   Complete by: As directed    Call MD for:  persistant dizziness or light-headedness   Complete by: As directed    Call MD for:  persistant nausea and vomiting   Complete by: As directed    Call MD for:  redness, tenderness, or signs of infection (pain, swelling, redness, odor or green/yellow discharge around incision site)   Complete by: As directed    Call MD for:  severe uncontrolled pain   Complete by: As directed    Call MD for:  temperature >100.4   Complete by: As directed    Diet - low sodium heart  healthy   Complete by: As directed    Discharge wound care:   Complete by: As directed    Remove honeycomb dressing on 03/15/21 at home, okay to shower with dressing and after dressing is removed   Driving Restrictions   Complete by: As directed    2 weeks or more as needed   Increase activity slowly   Complete by: As directed    Lifting restrictions   Complete by: As directed    No more than 15 lbs for 6 weeks   Other Restrictions   Complete by: As directed    Showers only. Do not sit/ soak in a bath   Sexual Activity Restrictions   Complete by: As directed    6 weeks     Allergies as of 03/11/2021      Reactions   Prednisone Itching, Anxiety   Shakes and nervousness       Medication List    TAKE these medications   acetaminophen 500 MG tablet Commonly known as: TYLENOL Take 500-1,000 mg by mouth every 6 (six) hours as needed (pain).   ADULT ONE DAILY GUMMIES PO Take 2 tablets by mouth daily with lunch. Women's Multivitamin   APPLE CIDER VINEGAR PO Take 5 mLs by mouth 3 (three) times daily with meals.   cetirizine 10 MG tablet Commonly known as: ZYRTEC Take 10 mg by mouth daily with lunch.   ELDERBERRY PO Take 50 mg by mouth daily. Gummy   HM Super Vitamin B12 2500 MCG Chew Generic  drug: Cyanocobalamin Chew 2,500 mcg by mouth in the morning.   ibuprofen 800 MG tablet Commonly known as: ADVIL Take 1 tablet (800 mg total) by mouth every 6 (six) hours.   melatonin 5 MG Tabs Take 5 mg by mouth at bedtime.   naproxen sodium 220 MG tablet Commonly known as: ALEVE Take 220 mg by mouth daily as needed (pain).   omeprazole 20 MG capsule Commonly known as: PRILOSEC Take 20 mg by mouth daily as needed (heartburn/indigestion).   Oxycodone HCl 10 MG Tabs Take 0.5 tablets (5 mg total) by mouth every 6 (six) hours as needed for up to 5 days for moderate pain or severe pain.   TESTOSTERONE CYPIONATE IM Inject 1 Dose into the muscle every 3 (three) months.    Vitamin D-3 125 MCG (5000 UT) Tabs Take 5,000 Units by mouth in the morning.   Zinc 50 MG Tabs Take 50 mg by mouth in the morning.            Discharge Care Instructions  (From admission, onward)         Start     Ordered   03/11/21 0000  Discharge wound care:       Comments: Remove honeycomb dressing on 03/15/21 at home, okay to shower with dressing and after dressing is removed   03/11/21 0815          Follow-up Information    Azucena Fallen, MD Follow up in 2 week(s).   Specialty: Obstetrics and Gynecology Contact information: [place] [address] [phone]               Signed: Elveria Royals 03/11/2021, 8:16 AM

## 2021-03-11 NOTE — Progress Notes (Signed)
2 Days Post-Op Procedure(s) (LRB): ABDOMINALHYSTERECTOMY ABDOMINAL WITH BILATERAL SALPINGO-OOPHORECTOMY (Bilateral)  Subjective: Patient reports tolerating PO and no problems voiding. Pain controlled with meds. No vag bleeding.Flatus +  Objective: I have reviewed patient's vital signs, intake and output, medications and labs.  General: alert and cooperative Resp: clear to auscultation bilaterally Cardio: regular rate and rhythm, S1, S2 normal, no murmur, click, rub or gallop GI: soft, non-tender; bowel sounds normal; no masses,  no organomegaly and incision: clean, dry and intact Extremities: extremities normal, atraumatic, no cyanosis or edema and Homans sign is negative, no sign of DVT Vaginal Bleeding: none  Assessment: s/p Procedure(s): ABDOMINALHYSTERECTOMY ABDOMINAL WITH BILATERAL SALPINGO-OOPHORECTOMY (Bilateral): stable, progressing well, tolerating diet and ambulating, passing flatus.   Plan: Discharge home. Post op care and warning s/s d/w pt and after-hr service if needed  F/up 2 wks   LOS: 2 days    Elveria Royals 03/11/2021, 8:07 AM

## 2021-03-11 NOTE — Progress Notes (Signed)
Discharge instructions reviewed with pt and instructed on where to pick up prescriptions. Pt verbalized understanding and had no questions.  Pt discharged in stable condition via wheelchair with family.  Sabrina Peterson
